# Patient Record
Sex: Male | Born: 1995 | Race: Black or African American | Hispanic: No | Marital: Married | Smoking: Current some day smoker
Health system: Southern US, Community
[De-identification: ages and names within clinical notes are randomized; demographics above are authoritative.]

## PROBLEM LIST (undated history)

## (undated) DIAGNOSIS — I10 Essential (primary) hypertension: Secondary | ICD-10-CM

## (undated) HISTORY — PX: WRIST SURGERY: SHX841

---

## 2019-02-01 ENCOUNTER — Other Ambulatory Visit: Payer: Self-pay

## 2019-02-01 ENCOUNTER — Emergency Department (HOSPITAL_COMMUNITY): Payer: Self-pay

## 2019-02-01 ENCOUNTER — Emergency Department (HOSPITAL_COMMUNITY)
Admission: EM | Admit: 2019-02-01 | Discharge: 2019-02-01 | Disposition: A | Discharge status: Home / Self Care | Payer: Self-pay | Attending: Emergency Medicine | Admitting: Emergency Medicine

## 2019-02-01 ENCOUNTER — Encounter (HOSPITAL_COMMUNITY): Payer: Self-pay | Admitting: Emergency Medicine

## 2019-02-01 DIAGNOSIS — I10 Essential (primary) hypertension: Secondary | ICD-10-CM | POA: Insufficient documentation

## 2019-02-01 DIAGNOSIS — W19XXXA Unspecified fall, initial encounter: Secondary | ICD-10-CM

## 2019-02-01 DIAGNOSIS — Y999 Unspecified external cause status: Secondary | ICD-10-CM | POA: Insufficient documentation

## 2019-02-01 DIAGNOSIS — M546 Pain in thoracic spine: Secondary | ICD-10-CM | POA: Insufficient documentation

## 2019-02-01 DIAGNOSIS — W01198A Fall on same level from slipping, tripping and stumbling with subsequent striking against other object, initial encounter: Secondary | ICD-10-CM | POA: Insufficient documentation

## 2019-02-01 DIAGNOSIS — Y9389 Activity, other specified: Secondary | ICD-10-CM | POA: Insufficient documentation

## 2019-02-01 DIAGNOSIS — Y92002 Bathroom of unspecified non-institutional (private) residence single-family (private) house as the place of occurrence of the external cause: Secondary | ICD-10-CM | POA: Insufficient documentation

## 2019-02-01 DIAGNOSIS — Z79899 Other long term (current) drug therapy: Secondary | ICD-10-CM | POA: Insufficient documentation

## 2019-02-01 DIAGNOSIS — F17200 Nicotine dependence, unspecified, uncomplicated: Secondary | ICD-10-CM | POA: Insufficient documentation

## 2019-02-01 HISTORY — DX: Essential (primary) hypertension: I10

## 2019-02-01 MED ORDER — HYDROCODONE-ACETAMINOPHEN 5-325 MG PO TABS
1.0000 | ORAL_TABLET | Freq: Once | ORAL | Status: AC
  Administered 2019-02-01: 1 via ORAL
  Filled 2019-02-01: qty 1

## 2019-02-01 MED ORDER — CYCLOBENZAPRINE HCL 10 MG PO TABS: 10.0000 mg | ORAL_TABLET | Freq: Two times a day (BID) | ORAL | 0 refills | Status: AC | PRN

## 2019-02-01 MED ORDER — NAPROXEN 375 MG PO TABS: 375.0000 mg | ORAL_TABLET | Freq: Two times a day (BID) | ORAL | 0 refills | Status: AC

## 2019-02-01 NOTE — Discharge Instructions (Addendum)
X-ray showed no fractures.  Ice pack for 2 to 3 days, then heat.  Prescription for anti-inflammatory and muscle relaxer.

## 2019-02-01 NOTE — ED Triage Notes (Signed)
Pt fell yesterday in bathroom making impact to upper back above shoulder blades. Pt denies hitting head. Pt c/o upper back pain that is worsening today. Pt has tried advil and tylenol with ice and heat yesterday. No relief.

## 2019-02-01 NOTE — ED Provider Notes (Addendum)
[facility] DEPT Provider Note   CSN: [id number] Arrival date & time: 02/01/19  0848     History   Chief Complaint Chief Complaint  Patient presents with  . Back Pain  . Fall    HPI Shawn Chandler is a 23 y.o. male.     Status post accidental slip and fall yesterday in the bathroom striking his mid upper back.  No other obvious injuries.  He took ibuprofen, Tylenol, heat, ice at home with minimal success.  Severity of pain is moderate.  Pain is worse with palpation.     Past Medical History:  Diagnosis Date  . Hypertension     There are no active problems to display for this patient.   Past Surgical History:  Procedure Laterality Date  . WRIST SURGERY          Home Medications    Prior to Admission medications   Medication Sig Start Date End Date Taking? Authorizing Provider  cyclobenzaprine (FLEXERIL) 10 MG tablet Take 1 tablet (10 mg total) by mouth 2 (two) times daily as needed for muscle spasms. 02/01/19   Nat Christen, MD  naproxen (NAPROSYN) 375 MG tablet Take 1 tablet (375 mg total) by mouth 2 (two) times daily. 02/01/19   Nat Christen, MD    Family History No family history on file.  Social History Social History   Tobacco Use  . Smoking status: Current Some Day Smoker  . Smokeless tobacco: Never Used  Substance Use Topics  . Alcohol use: Yes  . Drug use: Yes    Types: Marijuana     Allergies   Patient has no known allergies.   Review of Systems Review of Systems  All other systems reviewed and are negative.    Physical Exam Updated Vital Signs BP (!) 141/93   Pulse 97   Temp 98.2 F (36.8 C) (Oral)   Resp 16   Ht 6' (1.829 m)   Wt 72.6 kg   SpO2 100%   BMI 21.70 kg/m   Physical Exam Vitals signs and nursing note reviewed.  Constitutional:      Appearance: He is well-developed.  HENT:     Head: Normocephalic and atraumatic.  Eyes:     Conjunctiva/sclera: Conjunctivae normal.  Neck:    Musculoskeletal: Neck supple.  Cardiovascular:     Rate and Rhythm: Normal rate and regular rhythm.  Pulmonary:     Effort: Pulmonary effort is normal.     Breath sounds: Normal breath sounds.  Abdominal:     General: Bowel sounds are normal.     Palpations: Abdomen is soft.  Musculoskeletal:     Comments: Paraspinous tenderness in the thoracic 4,5,6 region.  Skin:    General: Skin is warm and dry.  Neurological:     General: No focal deficit present.     Mental Status: He is alert and oriented to person, place, and time.  Psychiatric:        Behavior: Behavior normal.      ED Treatments / Results  Labs (all labs ordered are listed, but only abnormal results are displayed) Labs Reviewed - No data to display  EKG None  Radiology Dg Thoracic Spine 2 View  Result Date: 02/01/2019 CLINICAL DATA:  Pain in the mid upper thoracic spine following a fall yesterday. EXAM: THORACIC SPINE 2 VIEWS COMPARISON:  None. FINDINGS: Minimal scoliosis. Otherwise, normal appearing bones. No fracture or subluxation IMPRESSION: No fracture or subluxation. Electronically Signed   By: Remo Lipps  Azucena Kuba M.D.   On: 02/01/2019 10:07    Procedures Procedures (including critical care time)  Medications Ordered in ED Medications  HYDROcodone-acetaminophen (NORCO/VICODIN) 5-325 MG per tablet 1 tablet (1 tablet Oral Given 02/01/19 0911)     Initial Impression / Assessment and Plan / ED Course  I have reviewed the triage vital signs and the nursing notes.  Pertinent labs & imaging results that were available during my care of the patient were reviewed by me and considered in my medical decision making (see chart for details).        We will obtain plain films of the thoracic spine.  Pain management.  1115: Recheck.  Plain films of thoracic spine negative.  Discharge medication Naprosyn 375 mg and Flexeril 10 mg.  Discussed with patient and his wife.  Final Clinical Impressions(s) / ED Diagnoses    Final diagnoses:  Fall, initial encounter  Acute thoracic back pain, unspecified back pain laterality    ED Discharge Orders         Ordered    naproxen (NAPROSYN) 375 MG tablet  2 times daily     02/01/19 1134    cyclobenzaprine (FLEXERIL) 10 MG tablet  2 times daily PRN     02/01/19 1134           Donnetta Hutching, MD 02/01/19 2694    Donnetta Hutching, MD 02/01/19 1136

## 2021-03-31 IMAGING — CR DG THORACIC SPINE 2V
3 series · 3 of 3 positions shown · non-contrast
Comparison: None.

CLINICAL DATA: Pain in the mid upper thoracic spine following a
fall yesterday.

EXAM:
THORACIC SPINE 2 VIEWS

[w thoracic spine ap]
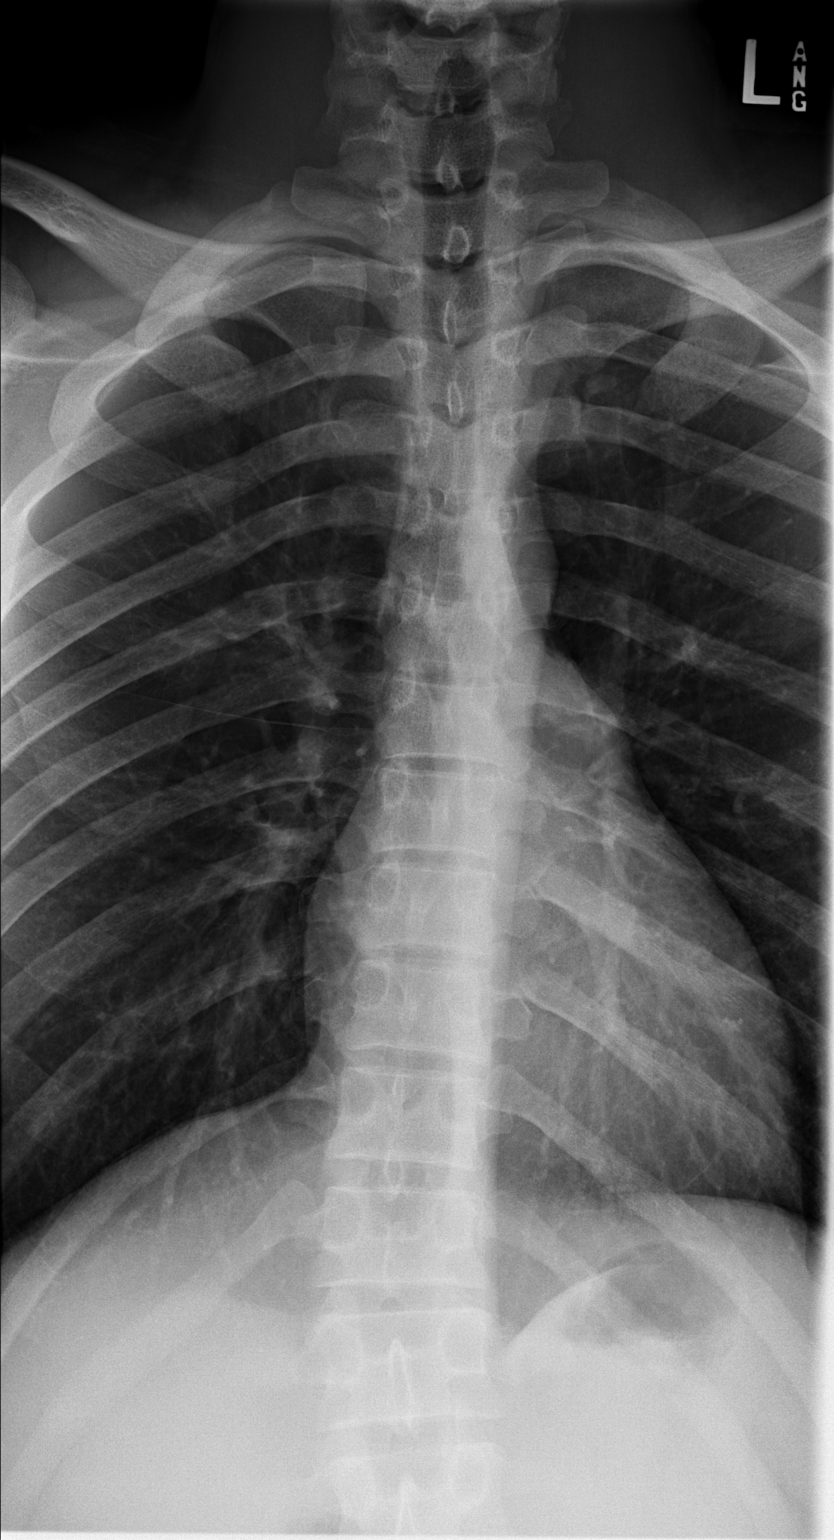

[w thoracic spine lat]
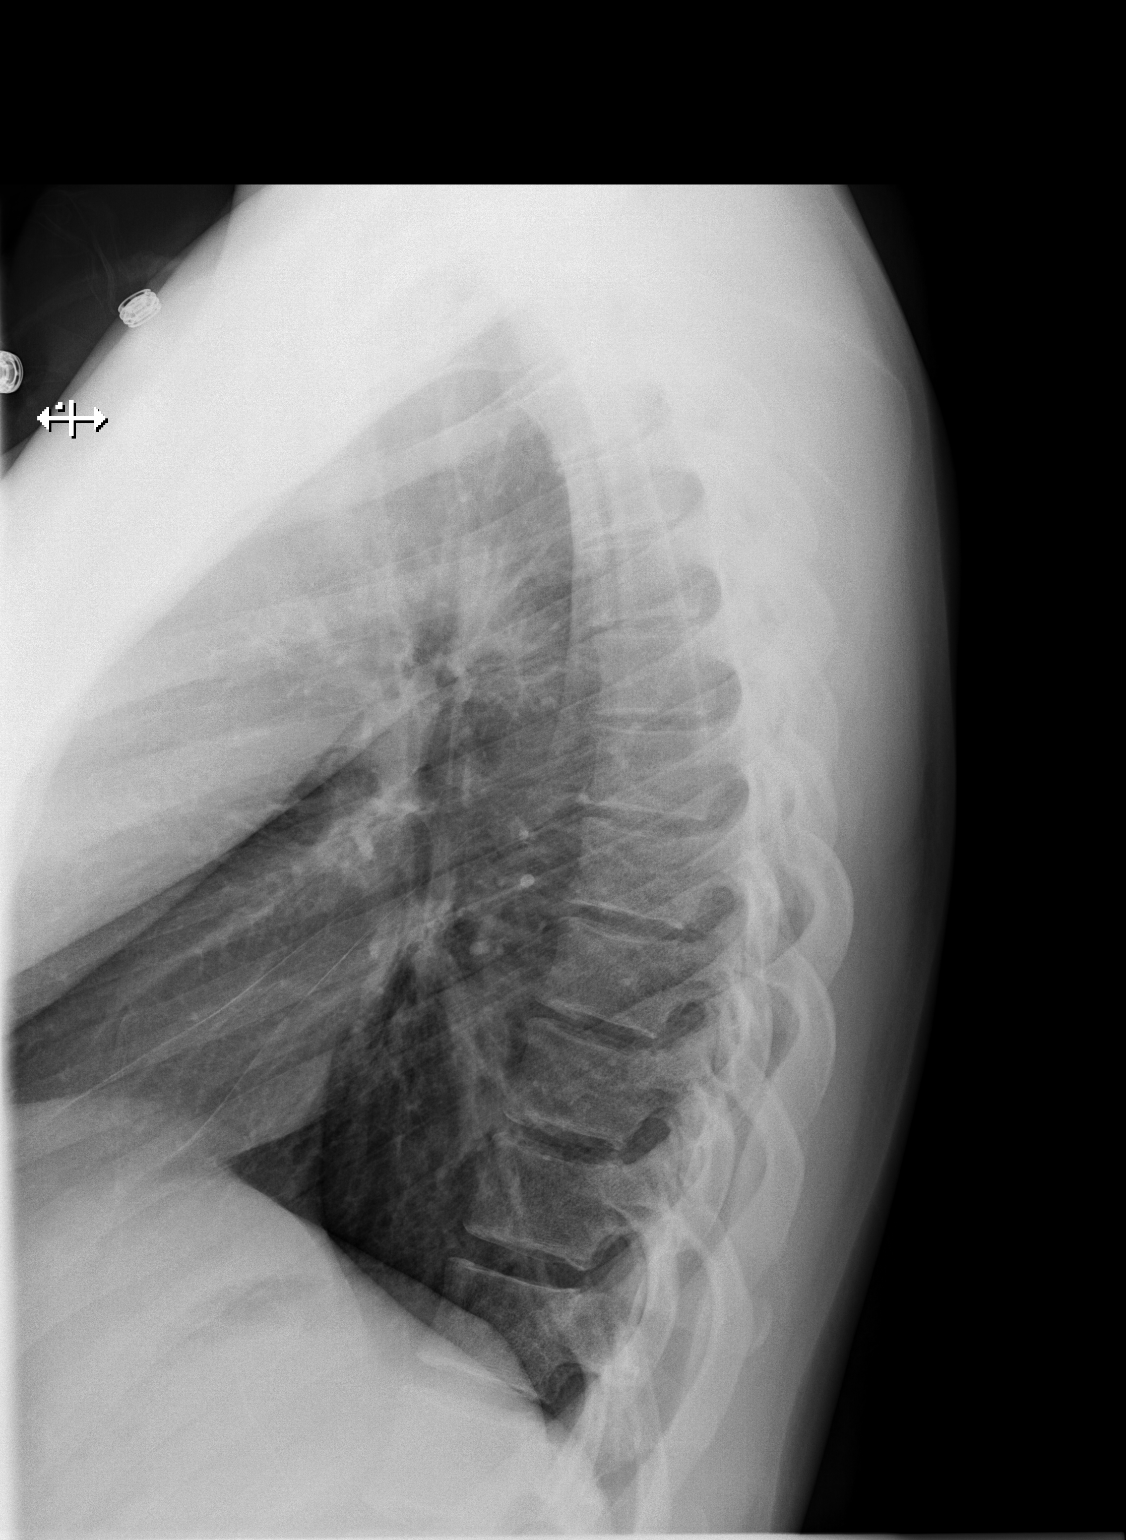

[w thoracic swimmers]
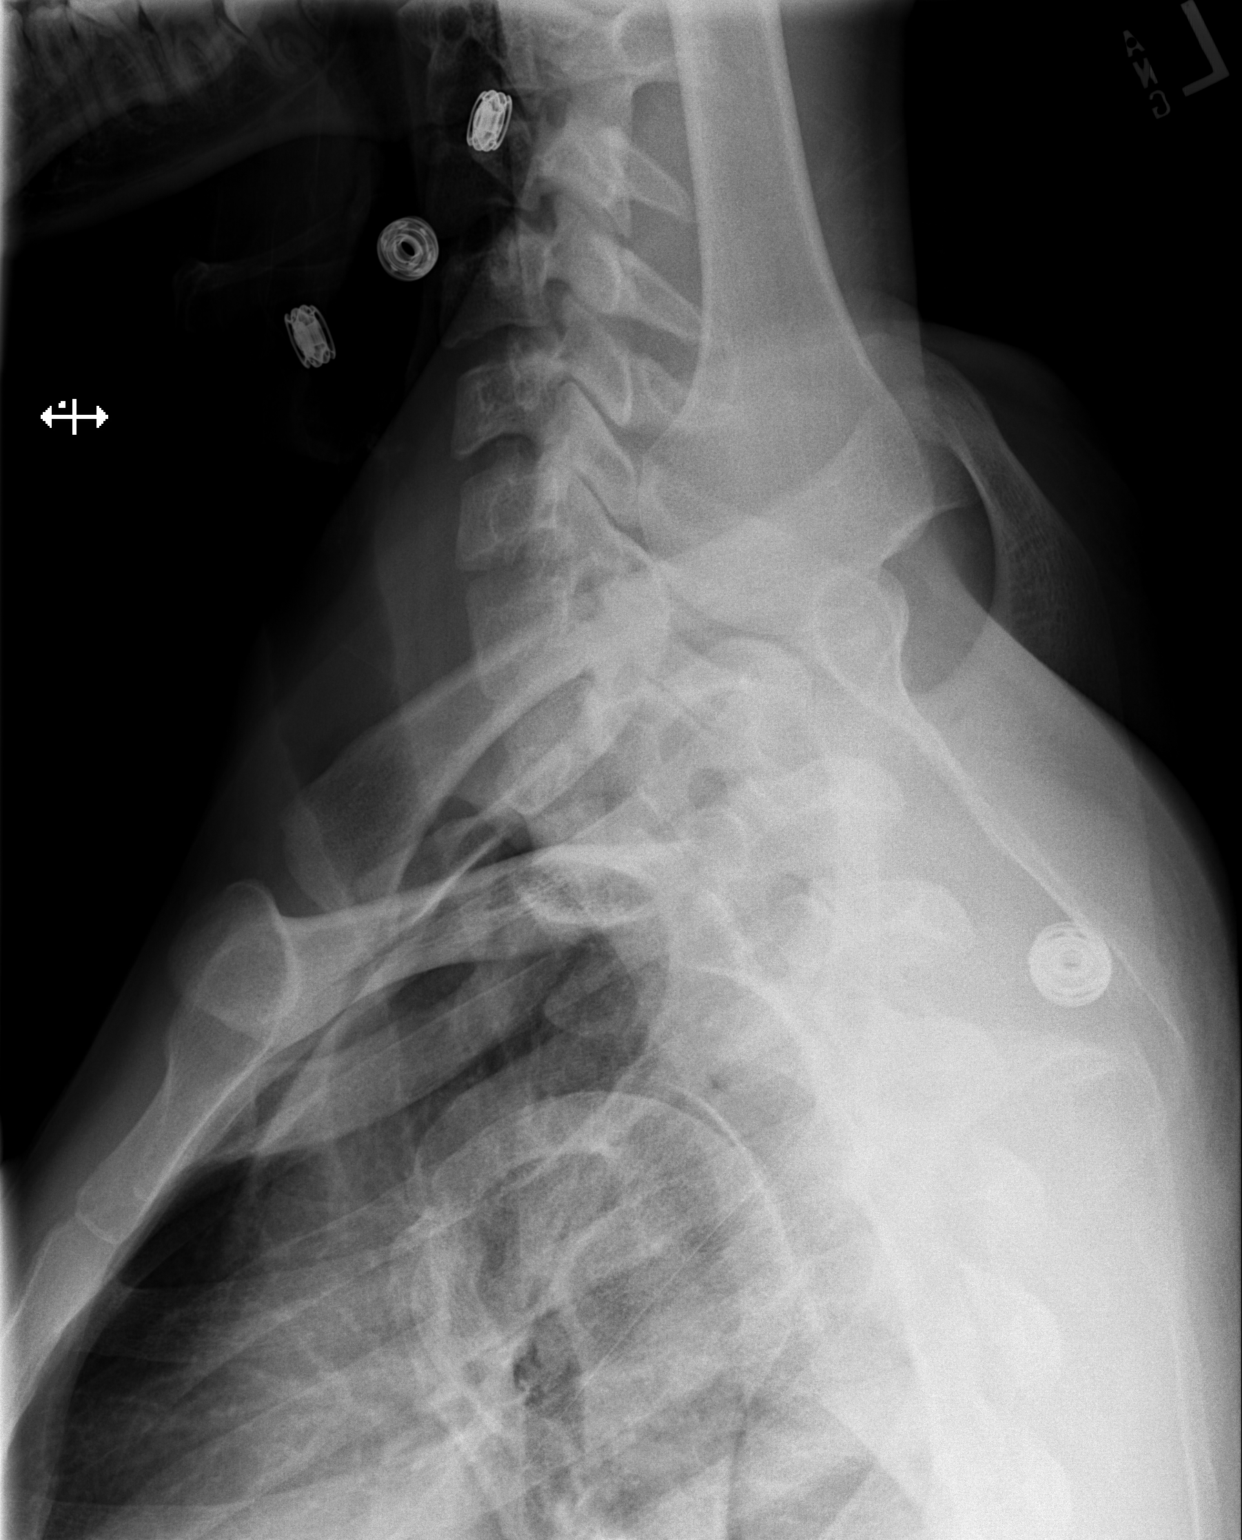

[3 of 3 positions shown; findings below may reference images not displayed]

FINDINGS: Minimal scoliosis. Otherwise, normal appearing bones. No fracture or
subluxation
IMPRESSION: No fracture or subluxation.
